# Patient Record
Sex: Female | Born: 1980 | Marital: Married | Smoking: Never smoker
Health system: Southern US, Community
[De-identification: ages and names within clinical notes are randomized; demographics above are authoritative.]

---

## 2020-08-24 ENCOUNTER — Ambulatory Visit (INDEPENDENT_AMBULATORY_CARE_PROVIDER_SITE_OTHER): Payer: Self-pay | Admitting: Orthopaedic Surgery

## 2020-08-24 ENCOUNTER — Encounter: Payer: Self-pay | Admitting: Orthopaedic Surgery

## 2020-08-24 DIAGNOSIS — M25531 Pain in right wrist: Secondary | ICD-10-CM

## 2020-08-24 DIAGNOSIS — R29898 Other symptoms and signs involving the musculoskeletal system: Secondary | ICD-10-CM

## 2020-08-24 MED ORDER — METHYLPREDNISOLONE 4 MG PO TABS: ORAL_TABLET | ORAL | 0 refills | Status: DC

## 2020-08-24 MED ORDER — GABAPENTIN 300 MG PO CAPS: 300.0000 mg | ORAL_CAPSULE | Freq: Every day | ORAL | 1 refills | Status: DC

## 2020-08-24 NOTE — Progress Notes (Signed)
Office Visit Note   Patient: Raven Cole           Date of Birth: 15-Aug-1981           MRN: [id number] Visit Date: 08/24/2020              Requested by: Galvin Proffer, MD [address] [place],  [place] [address] PCP: Galvin Proffer, MD   Assessment & Plan: Visit Diagnoses:  1. Weakness of right hand   2. Pain in right wrist     Plan: Based on my exam today, her signs and symptoms seem to be more consistent with the possibility of nerve impingement at the transverse carpal ligament.  I am going to put her on a 6-day steroid taper as well as Neurontin 300 mg to take at night.  I explained my treatment plan and her and her husband agree with this.  I would also like to send her for EMG/nerve conduction velocity studies to rule out carpal tunnel syndrome of the right upper extremity.  We will see her back once the studies have been performed.  All questions and concerns were answered and addressed.  Follow-Up Instructions: No follow-ups on file.   Orders:  No orders of the defined types were placed in this encounter.  No orders of the defined types were placed in this encounter.     Procedures: No procedures performed   Clinical Data: No additional findings.   Subjective: Chief Complaint  Patient presents with  . Right Wrist - Pain  The patient is I am seeing for the first time.  She is a right-hand-dominant female with a 4-year history of left hand and wrist pain and this is been slowly getting worse.  She was actually seen at [facility] and had a MRI of her left wrist in early 2019.  This showed some tenosynovitis of the flexor carpi radialis tendon.  She describes dominant hand weakness as well as numbness and tingling and pain that radiates up the arm.  This is been slowly getting worse for her.  She has had physical therapy and has been on Motrin.  It definitely affects her activities day living at this point and gets worse when she is doing activities such  as cleaning dishes.  It does wake her up at night as well.  She does not have any issues in other joints and has no active medical problems.  She is a healthy 39 year old female.  HPI  Review of Systems There is currently no headache, chest pain, shortness of breath, fever, chills, nausea, vomiting  Objective: Vital Signs: There were no vitals taken for this visit.  Physical Exam She is alert and oriented x3 and in no acute distress Ortho Exam Examination of her left and right hand shows no muscle atrophy in either hand.  Her left dominant hand has a stronger pinch and grip strength but then her right side which is symptomatic.  She does seem to have a positive Phalen's and Tinel's sign over the transverse carpal ligament area on the right side.  There is no pain over the first dorsal compartment.  She has good flexion and extension as well as pronation and supination of the wrist.  She has palpable pulses with the radial and ulnar pulse and good capillary refill in her digits. Specialty Comments:  No specialty comments available.  Imaging: No results found.   PMFS History: There are no problems to display for this patient.  History  reviewed. No pertinent past medical history.  History reviewed. No pertinent family history.  History reviewed. No pertinent surgical history. Social History   Occupational History  . Not on file  Tobacco Use  . Smoking status: Not on file  Substance and Sexual Activity  . Alcohol use: Not on file  . Drug use: Not on file  . Sexual activity: Not on file

## 2020-08-25 ENCOUNTER — Telehealth: Payer: Self-pay | Admitting: Physical Medicine and Rehabilitation

## 2020-08-25 NOTE — Telephone Encounter (Signed)
Called pt and sch 09/27/20. 

## 2020-08-25 NOTE — Telephone Encounter (Signed)
Patient returned call asked for a call back   The number to contact patient is [phone]

## 2020-08-25 NOTE — Addendum Note (Signed)
Addended by: Barbette Or on: 08/25/2020 08:29 AM   Modules accepted: Orders

## 2020-09-21 ENCOUNTER — Telehealth: Payer: Self-pay | Admitting: Physical Medicine and Rehabilitation

## 2020-09-21 NOTE — Telephone Encounter (Signed)
Patient called requesting a call back to reschedule appt. Please call pt at [phone].

## 2020-09-22 ENCOUNTER — Telehealth: Payer: Self-pay | Admitting: Physical Medicine and Rehabilitation

## 2020-09-22 NOTE — Telephone Encounter (Signed)
Returned patient's call to reschedule appointment. No answer and voicemail not set up.

## 2020-09-22 NOTE — Telephone Encounter (Signed)
Patient called. Missed a call to [facility] appointment. Would like a call back.

## 2020-09-23 NOTE — Telephone Encounter (Signed)
See previous message

## 2020-09-23 NOTE — Telephone Encounter (Signed)
Called patient again to reschedule. No answer and voicemail not set up.

## 2020-09-27 ENCOUNTER — Ambulatory Visit (INDEPENDENT_AMBULATORY_CARE_PROVIDER_SITE_OTHER): Payer: Self-pay | Admitting: Physical Medicine and Rehabilitation

## 2020-09-27 ENCOUNTER — Encounter: Payer: Self-pay | Admitting: Physical Medicine and Rehabilitation

## 2020-09-27 ENCOUNTER — Other Ambulatory Visit: Payer: Self-pay

## 2020-09-27 DIAGNOSIS — R202 Paresthesia of skin: Secondary | ICD-10-CM

## 2020-09-27 NOTE — Progress Notes (Signed)
Raven Cole - 40 y.o. female MRN [id number]  Date of birth: Jan 19, 1981  Office Visit Note: Visit Date: 09/27/2020 PCP: Galvin Proffer, MD Referred by: Galvin Proffer, MD  Subjective: Chief Complaint  Patient presents with  . Right Wrist - Pain  . Right Hand - Pain   HPI:  Raven Cole is a 40 y.o. female who comes in today at the request of Dr. Doneen Poisson for electrodiagnostic study of the Right upper extremities.  Patient is Right hand dominant.  She describes a 5-year history of worsening right hand and wrist pain.  She did have MRI showing tenosynovitis which was evident in 2019.  Dr. Magnus Ivan felt like there was some component of possibly a carpal tunnel syndrome or median neuropathy at the wrist.  She reports pain with activity and using the hand particularly washing dishes and cooking and just using the hand ADL.  She feels somewhat of a weakness with trying to use the hand.  She denies much in the way of left-sided complaints.  Pain is 7 out of 10.  She does endorse what she feels like is swelling at times as well as change in color with the hand turning blue.  She has no history of rheumatologic disease.  ROS Otherwise per HPI.  Assessment & Plan: Visit Diagnoses:    ICD-10-CM   1. Paresthesia of skin  R20.2 NCV with EMG (electromyography)    Plan: Impression: Essentially NORMAL electrodiagnostic study of the right upper limb.  There is no significant electrodiagnostic evidence of nerve entrapment, brachial plexopathy or cervical radiculopathy.    As you know, purely sensory or demyelinating radiculopathies and chemical radiculitis may not be detected with this particular electrodiagnostic study. **This electrodiagnostic study cannot rule out small fiber polyneuropathy and dysesthesias from central pain syndromes such as stroke or central pain sensitization syndromes such as fibromyalgia.  Myotomal referral pain from trigger points is also not  excluded.  Recommendations: 1.  Follow-up with referring physician. 2.  Continue current management of symptoms. 3.  Consider diagnostic carpal tunnel injection if clinically appropriate. Consider Rheumatologic testing.  Meds & Orders: No orders of the defined types were placed in this encounter.   Orders Placed This Encounter  Procedures  . NCV with EMG (electromyography)    Follow-up: Return for Doneen Poisson, MD.   Procedures: No procedures performed  EMG & NCV Findings: All nerve conduction studies (as indicated in the following tables) were within normal limits.    All examined muscles (as indicated in the following table) showed no evidence of electrical instability.    Impression: Essentially NORMAL electrodiagnostic study of the right upper limb.  There is no significant electrodiagnostic evidence of nerve entrapment, brachial plexopathy or cervical radiculopathy.    As you know, purely sensory or demyelinating radiculopathies and chemical radiculitis may not be detected with this particular electrodiagnostic study. **This electrodiagnostic study cannot rule out small fiber polyneuropathy and dysesthesias from central pain syndromes such as stroke or central pain sensitization syndromes such as fibromyalgia.  Myotomal referral pain from trigger points is also not excluded.  Recommendations: 1.  Follow-up with referring physician. 2.  Continue current management of symptoms. 3.  Consider diagnostic carpal tunnel injection if clinically appropriate. Consider Rheumatologic testing.  ___________________________ Raven Cole Board Certified, American Board of Physical Medicine and Rehabilitation    Nerve Conduction Studies Anti Sensory Summary Table   Stim Site NR Peak (ms) Norm Peak (ms) P-T Amp (V) Norm P-T Amp Site1 Site2  Delta-P (ms) Dist (cm) Vel (m/s) Norm Vel (m/s)  Right Median Acr Palm Anti Sensory (2nd Digit)  31.3C  Wrist    2.9 <3.6 46.3 >10  Wrist Palm 1.3 0.0    Palm    1.6 <2.0 75.4         Right Radial Anti Sensory (Base 1st Digit)  32.3C  Wrist    1.9 <3.1 30.4  Wrist Base 1st Digit 1.9 0.0    Right Ulnar Anti Sensory (5th Digit)  32C  Wrist    2.8 <3.7 47.8 >15.0 Wrist 5th Digit 2.8 14.0 50 >38   Motor Summary Table   Stim Site NR Onset (ms) Norm Onset (ms) O-P Amp (mV) Norm O-P Amp Site1 Site2 Delta-0 (ms) Dist (cm) Vel (m/s) Norm Vel (m/s)  Right Median Motor (Abd Poll Brev)  32.4C  Wrist    2.9 <4.2 9.4 >5 Elbow Wrist 3.7 21.0 57 >50  Elbow    6.6  8.9         Right Ulnar Motor (Abd Dig Min)  32.2C  Wrist    2.5 <4.2 11.1 >3 B Elbow Wrist 3.0 19.5 65 >53  B Elbow    5.5  11.6  A Elbow B Elbow 1.1 9.0 82 >53  A Elbow    6.6  11.7          EMG   Side Muscle Nerve Root Ins Act Fibs Psw Amp Dur Poly Recrt Int Dennie Bible Comment  Right Abd Poll Brev Median C8-T1 Nml Nml Nml Nml Nml 0 Nml Nml   Right 1stDorInt Ulnar C8-T1 Nml Nml Nml Nml Nml 0 Nml Nml   Right PronatorTeres Median C6-7 Nml Nml Nml Nml Nml 0 Nml Nml     Nerve Conduction Studies Anti Sensory Left/Right Comparison   Stim Site L Lat (ms) R Lat (ms) L-R Lat (ms) L Amp (V) R Amp (V) L-R Amp (%) Site1 Site2 L Vel (m/s) R Vel (m/s) L-R Vel (m/s)  Median Acr Palm Anti Sensory (2nd Digit)  31.3C  Wrist  2.9   46.3  Wrist Palm     Palm  1.6   75.4        Radial Anti Sensory (Base 1st Digit)  32.3C  Wrist  1.9   30.4  Wrist Base 1st Digit     Ulnar Anti Sensory (5th Digit)  32C  Wrist  2.8   47.8  Wrist 5th Digit  50    Motor Left/Right Comparison   Stim Site L Lat (ms) R Lat (ms) L-R Lat (ms) L Amp (mV) R Amp (mV) L-R Amp (%) Site1 Site2 L Vel (m/s) R Vel (m/s) L-R Vel (m/s)  Median Motor (Abd Poll Brev)  32.4C  Wrist  2.9   9.4  Elbow Wrist  57   Elbow  6.6   8.9        Ulnar Motor (Abd Dig Min)  32.2C  Wrist  2.5   11.1  B Elbow Wrist  65   B Elbow  5.5   11.6  A Elbow B Elbow  82   A Elbow  6.6   11.7           Waveforms:              Clinical History: No specialty comments available.     Objective:  VS:  HT:    WT:   BMI:     BP:   HR: bpm  TEMP: ( )  RESP:  Physical Exam Musculoskeletal:        General: No swelling, tenderness or deformity.     Comments: Inspection reveals no atrophy of the bilateral APB or FDI or hand intrinsics. There is no swelling, color changes, allodynia or dystrophic changes. There is 5 out of 5 strength in the bilateral wrist extension, finger abduction and long finger flexion. There is intact sensation to light touch in all dermatomal and peripheral nerve distributions.  There is a negative Hoffmann's test bilaterally.  Skin:    General: Skin is warm and dry.     Findings: No erythema or rash.  Neurological:     General: No focal deficit present.     Mental Status: She is alert and oriented to person, place, and time.     Motor: No weakness or abnormal muscle tone.     Coordination: Coordination normal.  Psychiatric:        Mood and Affect: Mood normal.        Behavior: Behavior normal.      Imaging: No results found.

## 2020-09-27 NOTE — Progress Notes (Signed)
Pt state pain in her right hand and wrist. Pt state while washing dish she can feel pain. Pt state cooking makes the pain worse. Pt state she is right handed. Pt state she feels sharpe pain in her hands. Pt state she has to rest her hand, take pain meds and use ice to help ease the pain.  Numeric Pain Rating Scale and Functional Assessment Average Pain 7   In the last MONTH (on 0-10 scale) has pain interfered with the following?  1. General activity like being  able to carry out your everyday physical activities such as walking, climbing stairs, carrying groceries, or moving a chair?  Rating(10)

## 2020-09-27 NOTE — Procedures (Signed)
EMG & NCV Findings: All nerve conduction studies (as indicated in the following tables) were within normal limits.    All examined muscles (as indicated in the following table) showed no evidence of electrical instability.    Impression: Essentially NORMAL electrodiagnostic study of the right upper limb.  There is no significant electrodiagnostic evidence of nerve entrapment, brachial plexopathy or cervical radiculopathy.    As you know, purely sensory or demyelinating radiculopathies and chemical radiculitis may not be detected with this particular electrodiagnostic study. **This electrodiagnostic study cannot rule out small fiber polyneuropathy and dysesthesias from central pain syndromes such as stroke or central pain sensitization syndromes such as fibromyalgia.  Myotomal referral pain from trigger points is also not excluded.  Recommendations: 1.  Follow-up with referring physician. 2.  Continue current management of symptoms. 3.  Consider diagnostic carpal tunnel injection if clinically appropriate. Consider Rheumatologic testing.  ___________________________ Raven Cole Board Certified, American Board of Physical Medicine and Rehabilitation    Nerve Conduction Studies Anti Sensory Summary Table   Stim Site NR Peak (ms) Norm Peak (ms) P-T Amp (V) Norm P-T Amp Site1 Site2 Delta-P (ms) Dist (cm) Vel (m/s) Norm Vel (m/s)  Right Median Acr Palm Anti Sensory (2nd Digit)  31.3C  Wrist    2.9 <3.6 46.3 >10 Wrist Palm 1.3 0.0    Palm    1.6 <2.0 75.4         Right Radial Anti Sensory (Base 1st Digit)  32.3C  Wrist    1.9 <3.1 30.4  Wrist Base 1st Digit 1.9 0.0    Right Ulnar Anti Sensory (5th Digit)  32C  Wrist    2.8 <3.7 47.8 >15.0 Wrist 5th Digit 2.8 14.0 50 >38   Motor Summary Table   Stim Site NR Onset (ms) Norm Onset (ms) O-P Amp (mV) Norm O-P Amp Site1 Site2 Delta-0 (ms) Dist (cm) Vel (m/s) Norm Vel (m/s)  Right Median Motor (Abd Poll Brev)  32.4C  Wrist    2.9  <4.2 9.4 >5 Elbow Wrist 3.7 21.0 57 >50  Elbow    6.6  8.9         Right Ulnar Motor (Abd Dig Min)  32.2C  Wrist    2.5 <4.2 11.1 >3 B Elbow Wrist 3.0 19.5 65 >53  B Elbow    5.5  11.6  A Elbow B Elbow 1.1 9.0 82 >53  A Elbow    6.6  11.7          EMG   Side Muscle Nerve Root Ins Act Fibs Psw Amp Dur Poly Recrt Int Dennie Bible Comment  Right Abd Poll Brev Median C8-T1 Nml Nml Nml Nml Nml 0 Nml Nml   Right 1stDorInt Ulnar C8-T1 Nml Nml Nml Nml Nml 0 Nml Nml   Right PronatorTeres Median C6-7 Nml Nml Nml Nml Nml 0 Nml Nml     Nerve Conduction Studies Anti Sensory Left/Right Comparison   Stim Site L Lat (ms) R Lat (ms) L-R Lat (ms) L Amp (V) R Amp (V) L-R Amp (%) Site1 Site2 L Vel (m/s) R Vel (m/s) L-R Vel (m/s)  Median Acr Palm Anti Sensory (2nd Digit)  31.3C  Wrist  2.9   46.3  Wrist Palm     Palm  1.6   75.4        Radial Anti Sensory (Base 1st Digit)  32.3C  Wrist  1.9   30.4  Wrist Base 1st Digit     Ulnar Anti Sensory (5th Digit)  32C  Wrist  2.8   47.8  Wrist 5th Digit  50    Motor Left/Right Comparison   Stim Site L Lat (ms) R Lat (ms) L-R Lat (ms) L Amp (mV) R Amp (mV) L-R Amp (%) Site1 Site2 L Vel (m/s) R Vel (m/s) L-R Vel (m/s)  Median Motor (Abd Poll Brev)  32.4C  Wrist  2.9   9.4  Elbow Wrist  57   Elbow  6.6   8.9        Ulnar Motor (Abd Dig Min)  32.2C  Wrist  2.5   11.1  B Elbow Wrist  65   B Elbow  5.5   11.6  A Elbow B Elbow  82   A Elbow  6.6   11.7           Waveforms:

## 2020-10-12 ENCOUNTER — Encounter: Payer: Self-pay | Admitting: Orthopaedic Surgery

## 2020-10-12 ENCOUNTER — Ambulatory Visit (INDEPENDENT_AMBULATORY_CARE_PROVIDER_SITE_OTHER): Payer: Self-pay | Admitting: Orthopaedic Surgery

## 2020-10-12 DIAGNOSIS — R29898 Other symptoms and signs involving the musculoskeletal system: Secondary | ICD-10-CM

## 2020-10-12 DIAGNOSIS — M25531 Pain in right wrist: Secondary | ICD-10-CM

## 2020-10-12 NOTE — Progress Notes (Signed)
The patient is following up after having nerve conduction studies of the right upper extremity.  She has been dealing with hand pain and weakness and burning sensations for over 3 years now.  She had had a MRI with another group in town of her wrist years ago showing some tenosynovitis of the flexor carpi radialis tendon.  Most of her pain seems to be in the hand itself and in the palm and along the base of the thumb.  It was appropriate to obtain nerve conduction studies to rule out nerve compression given her signs and symptoms.  She is still having the same symptoms.  When I examined her hand there is no muscle atrophy but she shows me where she hurts in the palm as well as in the thenar area near the thumb.  She has weak pinch and grip strength as well.  Her range of motion of her fingers as well as her thumb and her wrist are normal.  Nerve conduction study of the right upper extremity is normal with no evidence of nerve compression at the transverse carpal ligament or the cubital tunnel or any evidence of brachial plexus issues.  Her and her husband are frustrated given the weakness and pain that she experiences on a daily basis that is detrimentally affecting her mobility and her quality of life and her actives daily living especially with cooking and other activities.  At this point we will obtain a MRI of the hand and wrist on the right side to rule out any type of internal derangement that can be contributing to what she is experiencing.  They agree with this treatment plan.  We will see her back after the MRI.  I will then likely [facility] [place] for one of their hand specialist to take a look at.

## 2020-10-13 ENCOUNTER — Other Ambulatory Visit: Payer: Self-pay

## 2020-10-13 DIAGNOSIS — M79641 Pain in right hand: Secondary | ICD-10-CM

## 2020-11-10 ENCOUNTER — Ambulatory Visit
Admission: RE | Admit: 2020-11-10 | Discharge: 2020-11-10 | Disposition: A | Discharge status: Home / Self Care | Payer: Self-pay | Source: Ambulatory Visit | Attending: Orthopaedic Surgery | Admitting: Orthopaedic Surgery

## 2020-11-10 ENCOUNTER — Other Ambulatory Visit: Payer: Self-pay

## 2020-11-10 ENCOUNTER — Ambulatory Visit
Admission: RE | Admit: 2020-11-10 | Discharge: 2020-11-10 | Disposition: A | Discharge status: Home / Self Care | Payer: No Typology Code available for payment source | Source: Ambulatory Visit | Attending: Orthopaedic Surgery | Admitting: Orthopaedic Surgery

## 2020-11-10 DIAGNOSIS — M79641 Pain in right hand: Secondary | ICD-10-CM

## 2020-11-14 ENCOUNTER — Ambulatory Visit (INDEPENDENT_AMBULATORY_CARE_PROVIDER_SITE_OTHER): Payer: Self-pay | Admitting: Orthopaedic Surgery

## 2020-11-14 ENCOUNTER — Encounter: Payer: Self-pay | Admitting: Orthopaedic Surgery

## 2020-11-14 DIAGNOSIS — M79641 Pain in right hand: Secondary | ICD-10-CM

## 2020-11-14 DIAGNOSIS — R29898 Other symptoms and signs involving the musculoskeletal system: Secondary | ICD-10-CM

## 2020-11-14 DIAGNOSIS — M25531 Pain in right wrist: Secondary | ICD-10-CM

## 2020-11-14 NOTE — Progress Notes (Signed)
The patient comes in for continued evaluation treatment of right hand pain and weakness that does radiate into the wrist and forearm.  She is right-hand dominant.  This is been bothering her for a long period of time.  She was seen by another office who diagnosed flexor carpi radialis tendinitis and tenosynovitis.  She has been on anti-inflammatories.  I sent her for nerve conduction studies of the right upper extremity which were entirely normal from the neck all the way down to the fingertips.  There was no evidence of carpal tunnel syndrome or cubital tunnel syndrome.  There is no evidence of nerve compression at the brachial plexus.  She feels like that there is still swelling in the thenar area of her right hand and I cannot see that on my exam today.  I did send her for a MRI of her right wrist and her right hand.  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  At this point I am not sure what else that I have to offer her or recommend.  She is frustrated that the weakness that she does have in her hand and the pain that she has.  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

## 2020-11-15 ENCOUNTER — Other Ambulatory Visit: Payer: Self-pay

## 2020-11-15 DIAGNOSIS — M25531 Pain in right wrist: Secondary | ICD-10-CM

## 2020-11-15 DIAGNOSIS — R29898 Other symptoms and signs involving the musculoskeletal system: Secondary | ICD-10-CM

## 2020-11-15 DIAGNOSIS — M79641 Pain in right hand: Secondary | ICD-10-CM

## 2020-12-06 ENCOUNTER — Encounter: Payer: Self-pay | Admitting: *Deleted

## 2021-10-29 IMAGING — MR MR WRIST*R* W/O CM
4 of 6 series · 26 of 40 positions shown · non-contrast
Comparison: None.

CLINICAL DATA: Radial sided wrist pain. History of flexor carpi
radialis tenosynovitis on outside imaging.

EXAM:
MR OF THE RIGHT WRIST WITHOUT CONTRAST
TECHNIQUE: Multiplanar, multisequence MR imaging of the right wrist was
performed. No intravenous contrast was administered.

[Series 3: T2 fat-sat · axial · 3.0mm · 0.23mm/px · z∈[-31,+33]mm · 6 of 20 slices shown (1 of 2)]
[im 1/20]
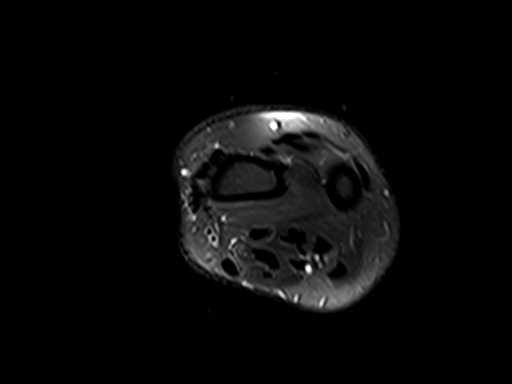
[im 4/20]
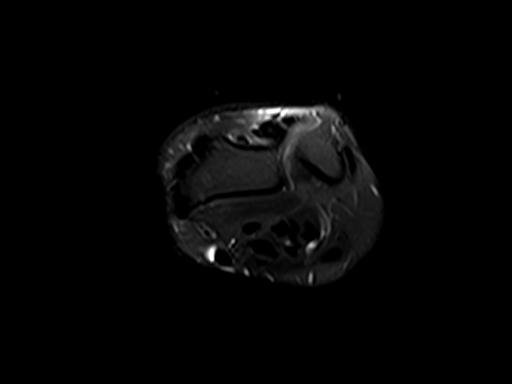
[im 8/20]
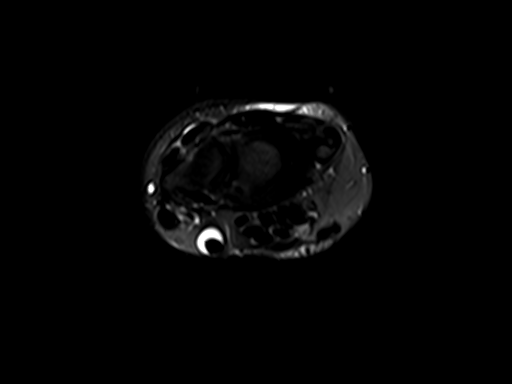
[im 12/20]
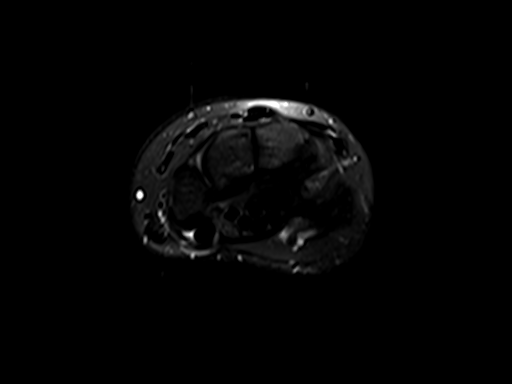
[im 16/20]
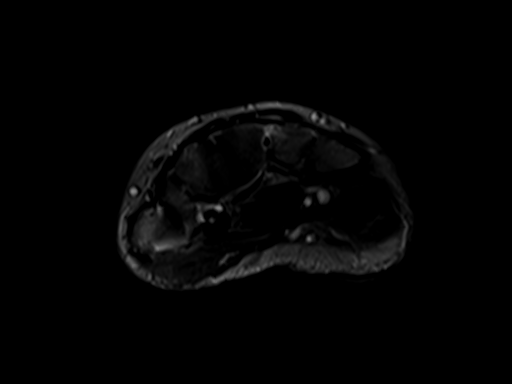
[im 20/20]
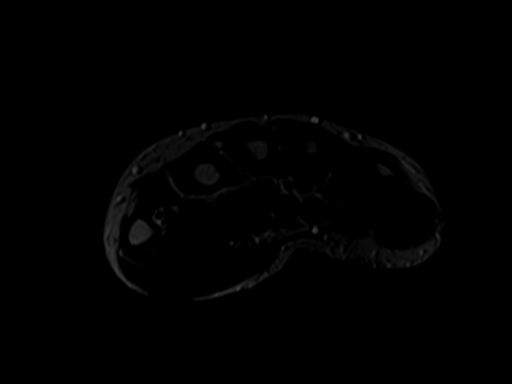

[Series 6: T2 fat-sat · coronal · 3.0mm · 0.23mm/px · 6 of 15 slices shown (2 of 2)]
[im 1/15]
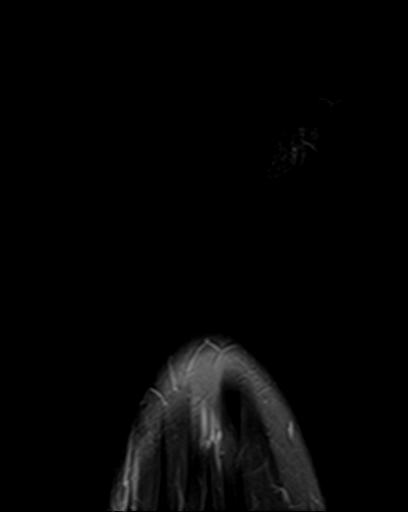
[im 3/15]
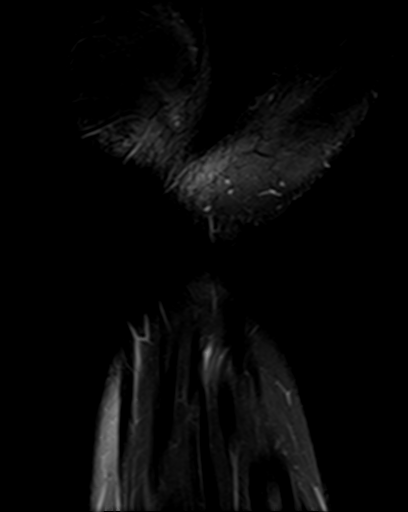
[im 6/15]
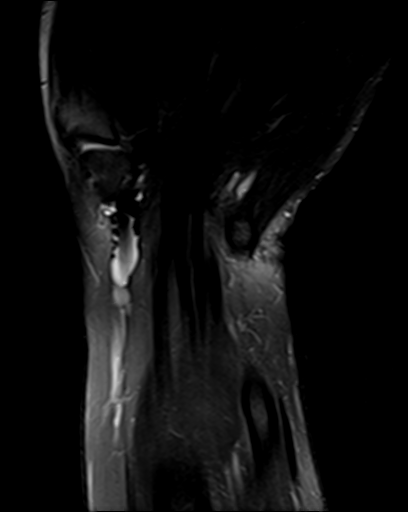
[im 9/15]
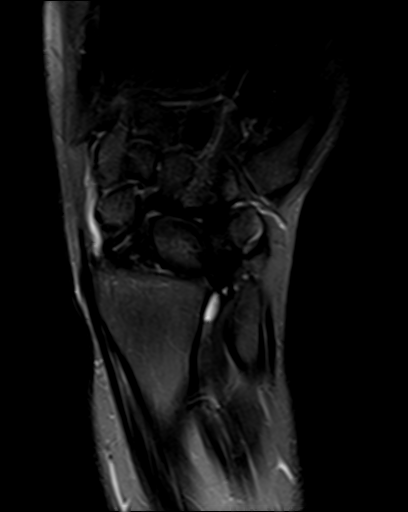
[im 12/15]
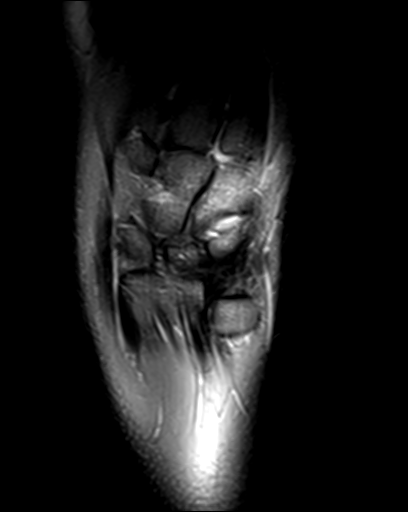
[im 15/15]
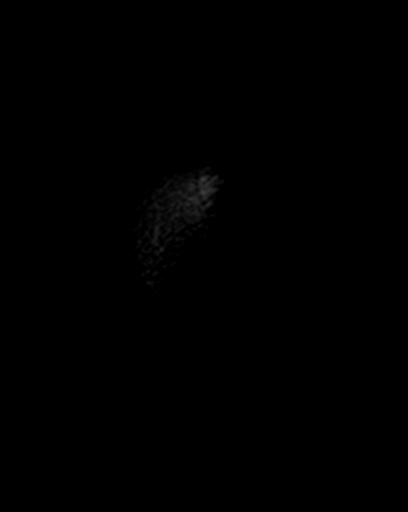

[Series 7: PD fat-sat · coronal · 3.0mm · 0.23mm/px · 6 of 15 slices shown (1 of 2)]
[im 1/15]
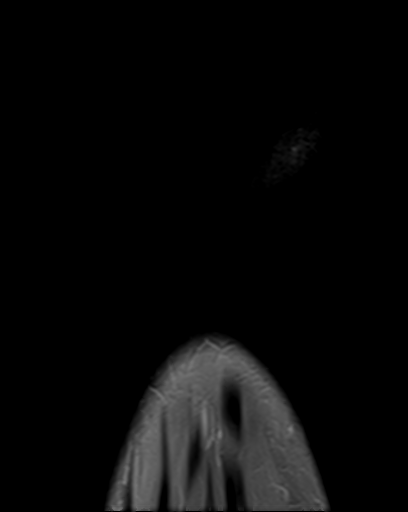
[im 3/15]
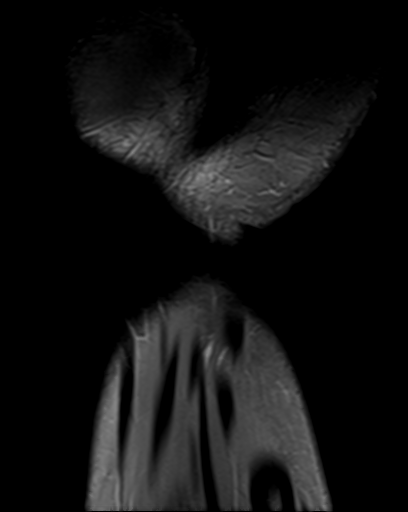
[im 6/15]
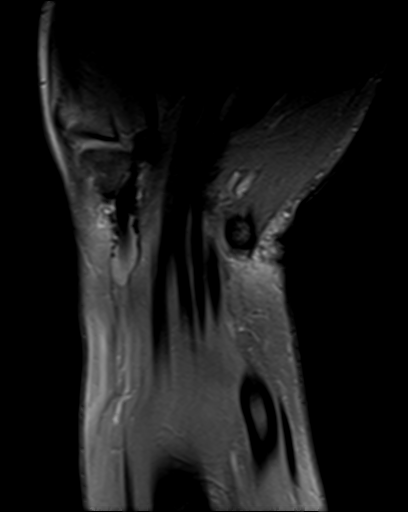
[im 9/15]
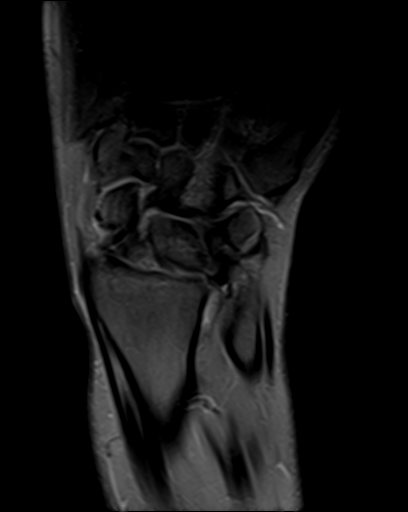
[im 12/15]
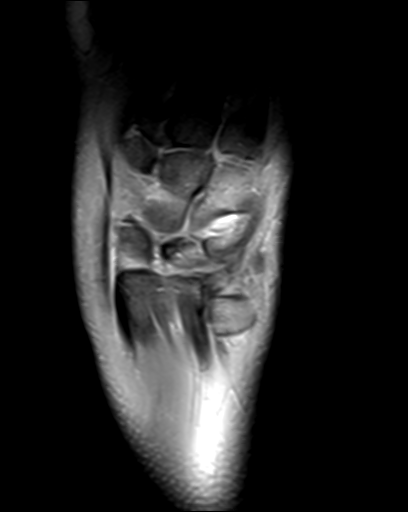
[im 15/15]
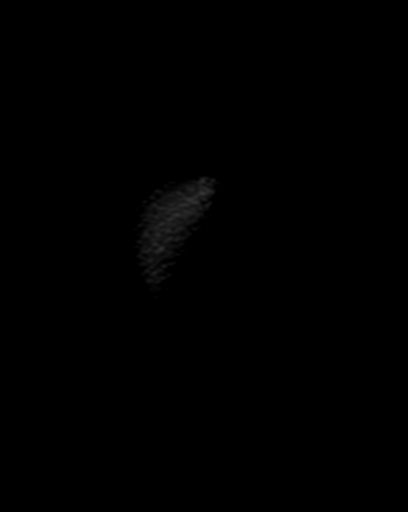

[Series 8: PD fat-sat · sagittal · 3.0mm · 0.23mm/px · 8 of 21 slices shown (2 of 2)]
[im 1/21]
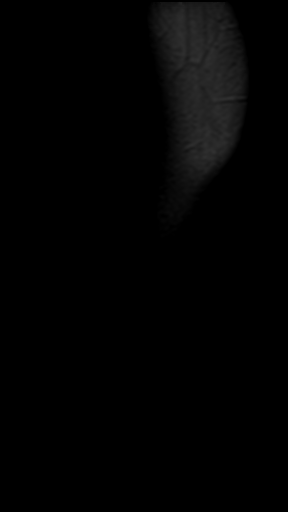
[im 3/21]
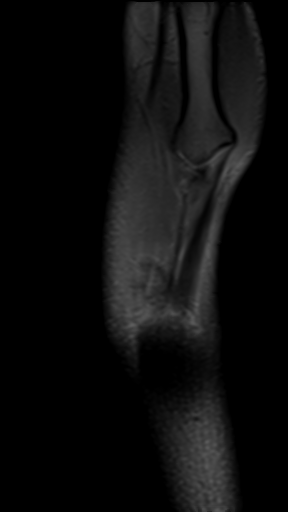
[im 6/21]
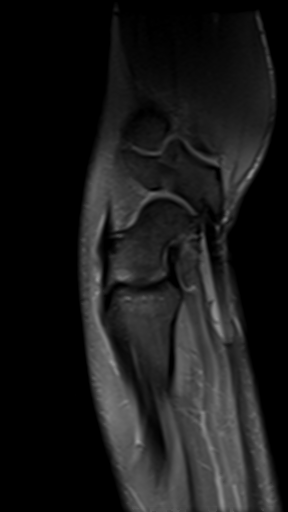
[im 9/21]
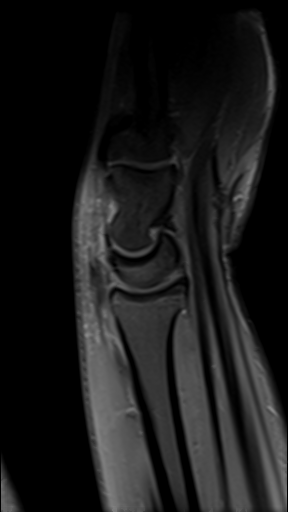
[im 12/21]
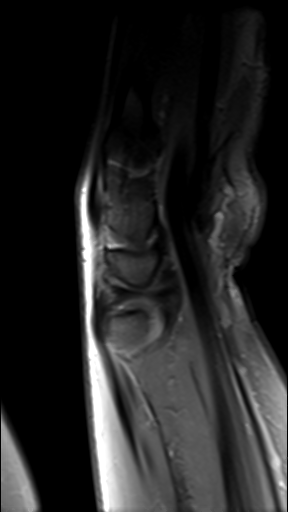
[im 15/21]
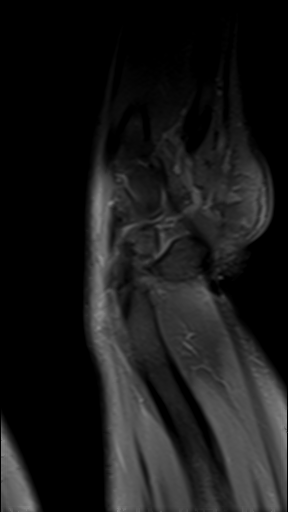
[im 18/21]
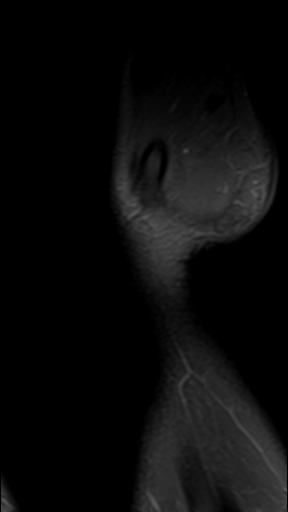
[im 21/21]
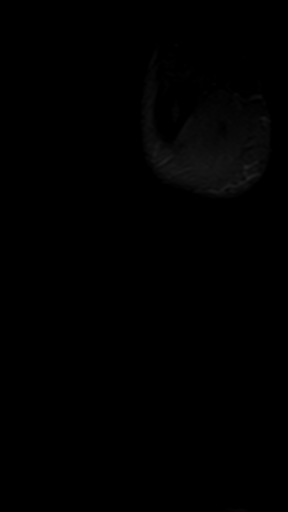

[26 of 40 positions shown; findings below may reference images not displayed]

FINDINGS: Ligaments: Intact scapholunate and lunotriquetral ligaments.

Triangular fibrocartilage: Intact TFCC.

Tendons: Mild tenosynovitis of the flexor carpi radialis tendon at
the level of the distal forearm and wrist (series 6, images 10-11).
Remaining flexor tendons are within normal limits. The extensor
tendons appear intact without tendinosis, tear, or tenosynovitis.

Carpal tunnel/median nerve: Normal carpal tunnel. Normal median
nerve.

Guyon's canal: Normal.

Joint/cartilage: No joint effusion. No chondral defect.

Bones/carpal alignment: No marrow signal abnormality. Normal
alignment. No aggressive osseous lesion.

Other: Negative for ganglion cyst. No soft tissue edema or fluid
collection.
IMPRESSION: 1. Mild tenosynovitis of the flexor carpi radialis tendon at the
level of the distal forearm and wrist.
2. Remainder of the right wrist is within normal limits. No
significant arthropathy.

## 2023-07-17 ENCOUNTER — Encounter: Payer: Self-pay | Admitting: Orthopaedic Surgery

## 2023-07-17 ENCOUNTER — Ambulatory Visit (INDEPENDENT_AMBULATORY_CARE_PROVIDER_SITE_OTHER): Payer: Self-pay | Admitting: Orthopaedic Surgery

## 2023-07-17 DIAGNOSIS — M5416 Radiculopathy, lumbar region: Secondary | ICD-10-CM

## 2023-07-17 MED ORDER — GABAPENTIN 300 MG PO CAPS: 300.0000 mg | ORAL_CAPSULE | Freq: Every day | ORAL | 1 refills | Status: DC

## 2023-07-17 NOTE — Progress Notes (Signed)
6  Office Visit Note   Patient: Raven Cole           Date of Birth: 09/29/1980           MRN: [id number] Visit Date: 07/17/2023              Requested by: Galvin Proffer, MD [address] [place],  [place] [address] PCP: Galvin Proffer, MD   Assessment & Plan: Visit Diagnoses:  1. Radiculopathy, lumbar region     Plan: Place her on Neurontin 300 mg nightly.  She is given back exercises handout sheets will have her work on these on her own.  She is not seeing improvement next 3 to 4 weeks she can call our office we could at that time look into getting an MRI for her.  I explained why we would obtain an MRI and this would be for possible epidural steroid injection.  She will let us know if she has no relief or her condition becomes worse.  Questions were encouraged and answered at length  Follow-Up Instructions: Return in about 4 weeks (around 08/14/2023).   Orders:  No orders of the defined types were placed in this encounter.  Meds ordered this encounter  Medications   gabapentin (NEURONTIN) 300 MG capsule    Sig: Take 1 capsule (300 mg total) by mouth at bedtime.    Dispense:  30 capsule    Refill:  1      Procedures: No procedures performed   Clinical Data: No additional findings.   Subjective: Chief Complaint  Patient presents with   Right Leg - Pain    HPI Patient's 42 year old female who was seen by Dr. Magnus Ivan in the past comes in today with new complaint of right leg and back pain.  She is referred by Dr. Karna Christmas for her back.  She states that she had x-rays at their office of her back which she reports were normal.  She does not have any insurance does not want to repeat x-rays with her radiographs or not available for [place] to view today.  She was placed on a Medrol Dosepak for 6 days with no relief.  Outside of that she is taking over-the-counter NSAIDs and Tylenol without any real relief.  She denies any waking pain weight loss bowel or bladder dysfunction  or saddle anesthesia.  She does note that she has 9 out of 10 pain down the lateral aspect of her right leg goes to the mid lateral calf region.  Does not extend into the foot.  Pain usually occurs when standing or walking.  Its better when lying down except if she turns over in bed.  Review of Systems  Constitutional:  Negative for chills, fever and unexpected weight change.  Musculoskeletal:  Positive for back pain.  Neurological:  Positive for numbness.     Objective: Vital Signs: There were no vitals taken for this visit.  Physical Exam Constitutional:      Appearance: She is normal weight. She is not ill-appearing or diaphoretic.  Cardiovascular:     Pulses: Normal pulses.  Pulmonary:     Effort: Pulmonary effort is normal.  Neurological:     Mental Status: She is alert and oriented to person, place, and time.  Psychiatric:        Mood and Affect: Mood normal.     Ortho Exam Lower extremity: Overall strength tenderness reveals consistent weakness with extension right great toe against resistance.  Patient has difficulty following  directions went through strength testing of the lower extremities 3 times.  Sensation grossly intact bilateral feet light touch.  Straight leg raise is positive on the right negative on the left.  Limited flexion extension lumbar spine.  Tight hamstrings bilaterally.  Tenderness right lower lumbar paraspinous region.  Also tenderness down the right leg from the greater trochanteric region down into the right lateral leg.  Calves are supple and nontender bilaterally.  Specialty Comments:  No specialty comments available.  Imaging: No results found.   PMFS History: There are no problems to display for this patient.  No past medical history on file.  No family history on file.  No past surgical history on file. Social History   Occupational History   Not on file  Tobacco Use   Smoking status: Not on file   Smokeless tobacco: Not on file   Substance and Sexual Activity   Alcohol use: Not on file   Drug use: Not on file   Sexual activity: Not on file

## 2023-08-12 ENCOUNTER — Ambulatory Visit: Payer: Self-pay | Admitting: Physician Assistant

## 2023-08-22 ENCOUNTER — Encounter: Payer: Self-pay | Admitting: Physician Assistant

## 2023-08-22 ENCOUNTER — Ambulatory Visit (INDEPENDENT_AMBULATORY_CARE_PROVIDER_SITE_OTHER): Payer: Self-pay | Admitting: Physician Assistant

## 2023-08-22 DIAGNOSIS — M5416 Radiculopathy, lumbar region: Secondary | ICD-10-CM

## 2023-08-22 NOTE — Progress Notes (Signed)
Office Visit Note   Patient: Raven Cole           Date of Birth: 06/07/1981           MRN: [id number] Visit Date: 08/22/2023              Requested by: Galvin Proffer, MD [address] [place],  [place] [address] PCP: Galvin Proffer, MD   Assessment & Plan: Visit Diagnoses:  1. Radiculopathy, lumbar region     Plan:  Given patient's failure of conservative treatment which is consisted of medications, exercise and time.  Also chiropractic treatment.  Recommend MRI of the lumbar spine to rule out HNP as the source of her radicular symptoms.  Questions were encouraged and answered.  She will follow-up with Dr. Magnus Ivan possibly 5 days after the MRI.  Questions were encouraged and answered at length.  She was shown IT band stretching exercises to perform.  Follow-Up Instructions: Return for Dr. Magnus Ivan after MRI.   Orders:  No orders of the defined types were placed in this encounter.  No orders of the defined types were placed in this encounter.     Procedures: No procedures performed   Clinical Data: No additional findings.   Subjective: Chief Complaint  Patient presents with   Right Leg - Pain    HPI Patient is a 42 year old female who comes back in today for back pain and radicular symptoms down the right leg.  Pain is 8-9 out of 10 pain at worst.  Runs from the buttocks region down to the right calf.  Does not go into the foot.  Pain only occurs when she is standing.  She also notices a click on the lateral side of her hip whenever she does exercises that she was told to do by her chiropractor.  She really found no difference in taking the gabapentin.  She has tried a Probation officer with no relief.  She is also taking over-the-counter NSAIDs and Tylenol without relief.  She denies any waking pain, weight loss, bowel or bladder dysfunction or saddle anesthesia like symptoms. She does bring a CD with her that has radiographs of her lumbar spine.  These were obtained by  her chiropractor.  AP and lateral views showed no acute fractures.  Slight loss about the curvature and lower lumbar facet arthritic changes.  Otherwise no spondylolisthesis.  No scoliosis.  Review of Systems  Constitutional:  Negative for chills and fever.     Objective: Vital Signs: There were no vitals taken for this visit.  Physical Exam Constitutional:      Appearance: She is not ill-appearing or diaphoretic.  Pulmonary:     Effort: Pulmonary effort is normal.  Neurological:     Mental Status: She is alert and oriented to person, place, and time.  Psychiatric:        Mood and Affect: Mood normal.     Ortho Exam Bilateral hips: Good range of motion both hips.  She has tenderness over the right trochanteric region of the knee.  There is a slight click over the troches region. Lumbar spine: 5 out of 5 strength throughout the lower extremity bilaterally, except right great toe extension against resistance reveals 4 out of 5 strength.  Compared to 5 out of 5 on the left with extension of the great toe against resistance.  Positive straight leg raise on the right negative on the left.  Sensation grossly intact bilateral feet.  Specialty Comments:  No specialty comments  available.  Imaging: No results found.   PMFS History: There are no problems to display for this patient.  History reviewed. No pertinent past medical history.  History reviewed. No pertinent family history.  History reviewed. No pertinent surgical history. Social History   Occupational History   Not on file  Tobacco Use   Smoking status: Not on file   Smokeless tobacco: Not on file  Substance and Sexual Activity   Alcohol use: Not on file   Drug use: Not on file   Sexual activity: Not on file

## 2024-07-24 ENCOUNTER — Encounter (HOSPITAL_COMMUNITY): Payer: Self-pay

## 2024-07-24 ENCOUNTER — Emergency Department (HOSPITAL_COMMUNITY)
Admission: EM | Admit: 2024-07-24 | Discharge: 2024-07-25 | Disposition: A | Discharge status: Home / Self Care | Payer: Self-pay | Attending: Emergency Medicine | Admitting: Emergency Medicine

## 2024-07-24 ENCOUNTER — Other Ambulatory Visit: Payer: Self-pay

## 2024-07-24 DIAGNOSIS — K047 Periapical abscess without sinus: Secondary | ICD-10-CM | POA: Insufficient documentation

## 2024-07-24 LAB — I-STAT CG4 LACTIC ACID, ED: Lactic Acid, Venous: 0.8 mmol/L (ref 0.5–1.9)

## 2024-07-24 NOTE — ED Triage Notes (Signed)
 Patient reports pain on the right side of the jaw. States she has swelling around the gums and some pockets of swelling inside her mouth. States she is unable to open her mouth. Patient went to urgent care and they advised her to come to the ER for IV antibiotics.

## 2024-07-25 LAB — CBC WITH DIFFERENTIAL/PLATELET
Abs Immature Granulocytes: 0.01 K/uL (ref 0.00–0.07)
Basophils Absolute: 0 K/uL (ref 0.0–0.1)
Basophils Relative: 0 %
Eosinophils Absolute: 0.1 K/uL (ref 0.0–0.5)
Eosinophils Relative: 1 %
HCT: 41.2 % (ref 36.0–46.0)
Hemoglobin: 13.2 g/dL (ref 12.0–15.0)
Immature Granulocytes: 0 %
Lymphocytes Relative: 30 %
Lymphs Abs: 3 K/uL (ref 0.7–4.0)
MCH: 27.2 pg (ref 26.0–34.0)
MCHC: 32 g/dL (ref 30.0–36.0)
MCV: 84.9 fL (ref 80.0–100.0)
Monocytes Absolute: 0.5 K/uL (ref 0.1–1.0)
Monocytes Relative: 5 %
Neutro Abs: 6.3 K/uL (ref 1.7–7.7)
Neutrophils Relative %: 64 %
Platelets: 259 K/uL (ref 150–400)
RBC: 4.85 MIL/uL (ref 3.87–5.11)
RDW: 12.2 % (ref 11.5–15.5)
WBC: 9.9 K/uL (ref 4.0–10.5)
nRBC: 0 % (ref 0.0–0.2)

## 2024-07-25 LAB — COMPREHENSIVE METABOLIC PANEL WITH GFR
ALT: 12 U/L (ref 0–44)
AST: 20 U/L (ref 15–41)
Albumin: 4.3 g/dL (ref 3.5–5.0)
Alkaline Phosphatase: 87 U/L (ref 38–126)
Anion gap: 11 (ref 5–15)
BUN: 13 mg/dL (ref 6–20)
CO2: 26 mmol/L (ref 22–32)
Calcium: 9.5 mg/dL (ref 8.9–10.3)
Chloride: 102 mmol/L (ref 98–111)
Creatinine, Ser: 0.68 mg/dL (ref 0.44–1.00)
GFR, Estimated: >60 mL/min (ref >60)
Glucose, Bld: 117 mg/dL — ABNORMAL HIGH (ref 70–99)
Potassium: 3.9 mmol/L (ref 3.5–5.1)
Sodium: 139 mmol/L (ref 135–145)
Total Bilirubin: 0.6 mg/dL (ref 0.0–1.2)
Total Protein: 8.1 g/dL (ref 6.5–8.1)

## 2024-07-25 LAB — HCG, SERUM, QUALITATIVE: Preg, Serum: NEGATIVE

## 2024-07-25 MED ORDER — AMOXICILLIN-POT CLAVULANATE 875-125 MG PO TABS
1.0000 | ORAL_TABLET | Freq: Once | ORAL | Status: AC
  Administered 2024-07-25: 1 via ORAL
  Filled 2024-07-25: qty 1

## 2024-07-25 MED ORDER — AMOXICILLIN 500 MG PO CAPS: 1000.0000 mg | ORAL_CAPSULE | Freq: Two times a day (BID) | ORAL | 0 refills | Status: AC

## 2024-07-25 MED ORDER — IBUPROFEN 200 MG PO TABS
400.0000 mg | ORAL_TABLET | Freq: Once | ORAL | Status: AC
  Administered 2024-07-25: 400 mg via ORAL
  Filled 2024-07-25: qty 2

## 2024-07-25 MED ORDER — HYDROCODONE-ACETAMINOPHEN 5-325 MG PO TABS
1.0000 | ORAL_TABLET | Freq: Once | ORAL | Status: AC
  Administered 2024-07-25: 1 via ORAL
  Filled 2024-07-25: qty 1

## 2024-07-25 MED ORDER — HYDROCODONE-ACETAMINOPHEN 5-325 MG PO TABS: 1.0000 | ORAL_TABLET | Freq: Four times a day (QID) | ORAL | 0 refills | Status: AC | PRN
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
## 2024-07-25 NOTE — Discharge Instructions (Addendum)
 You can take ibuprofen 400 mg 3 times a day for a week You can use the Vicodin as needed for pain. Be sure to take the amoxicillin as prescribed

## 2024-09-12 ENCOUNTER — Other Ambulatory Visit: Payer: Self-pay

## 2024-09-12 ENCOUNTER — Encounter (HOSPITAL_COMMUNITY): Payer: Self-pay

## 2024-09-12 ENCOUNTER — Emergency Department (HOSPITAL_COMMUNITY): Payer: Self-pay

## 2024-09-12 ENCOUNTER — Emergency Department (HOSPITAL_COMMUNITY)
Admission: EM | Admit: 2024-09-12 | Discharge: 2024-09-12 | Disposition: A | Discharge status: Home / Self Care | Payer: Self-pay | Attending: Emergency Medicine | Admitting: Emergency Medicine

## 2024-09-12 DIAGNOSIS — M542 Cervicalgia: Secondary | ICD-10-CM | POA: Insufficient documentation

## 2024-09-12 DIAGNOSIS — M25531 Pain in right wrist: Secondary | ICD-10-CM | POA: Insufficient documentation

## 2024-09-12 DIAGNOSIS — Y92410 Unspecified street and highway as the place of occurrence of the external cause: Secondary | ICD-10-CM | POA: Insufficient documentation

## 2024-09-12 DIAGNOSIS — M25551 Pain in right hip: Secondary | ICD-10-CM | POA: Insufficient documentation

## 2024-09-12 MED ORDER — IBUPROFEN 600 MG PO TABS: 600.0000 mg | ORAL_TABLET | Freq: Three times a day (TID) | ORAL | 0 refills | Status: AC | PRN

## 2024-09-12 MED ORDER — CYCLOBENZAPRINE HCL 10 MG PO TABS: 10.0000 mg | ORAL_TABLET | Freq: Two times a day (BID) | ORAL | 0 refills | Status: AC | PRN
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
